# Patient Record
Sex: Male | Born: 1965 | Race: White | Hispanic: No | Smoking: Current every day smoker
Health system: Southern US, Community
[De-identification: ages and names within clinical notes are randomized; demographics above are authoritative.]

## PROBLEM LIST (undated history)

## (undated) DIAGNOSIS — M1990 Unspecified osteoarthritis, unspecified site: Secondary | ICD-10-CM

## (undated) DIAGNOSIS — F32A Depression, unspecified: Secondary | ICD-10-CM

## (undated) DIAGNOSIS — E7800 Pure hypercholesterolemia, unspecified: Secondary | ICD-10-CM

## (undated) DIAGNOSIS — R39198 Other difficulties with micturition: Secondary | ICD-10-CM

## (undated) DIAGNOSIS — F419 Anxiety disorder, unspecified: Secondary | ICD-10-CM

## (undated) DIAGNOSIS — F329 Major depressive disorder, single episode, unspecified: Secondary | ICD-10-CM

## (undated) DIAGNOSIS — I10 Essential (primary) hypertension: Secondary | ICD-10-CM

## (undated) DIAGNOSIS — M797 Fibromyalgia: Secondary | ICD-10-CM

## (undated) HISTORY — PX: TONSILLECTOMY: SUR1361

## (undated) HISTORY — PX: CHOLECYSTECTOMY: SHX55

## (undated) HISTORY — PX: HERNIA REPAIR: SHX51

## (undated) HISTORY — PX: APPENDECTOMY: SHX54

---

## 2013-10-23 ENCOUNTER — Ambulatory Visit: Payer: Medicaid Other | Admitting: Neurology

## 2018-01-04 DIAGNOSIS — E785 Hyperlipidemia, unspecified: Secondary | ICD-10-CM | POA: Diagnosis not present

## 2018-01-04 DIAGNOSIS — Z720 Tobacco use: Secondary | ICD-10-CM | POA: Diagnosis not present

## 2018-01-04 DIAGNOSIS — R739 Hyperglycemia, unspecified: Secondary | ICD-10-CM | POA: Diagnosis not present

## 2018-01-04 DIAGNOSIS — N400 Enlarged prostate without lower urinary tract symptoms: Secondary | ICD-10-CM | POA: Diagnosis not present

## 2018-01-04 DIAGNOSIS — M797 Fibromyalgia: Secondary | ICD-10-CM | POA: Diagnosis not present

## 2018-01-04 DIAGNOSIS — I639 Cerebral infarction, unspecified: Secondary | ICD-10-CM | POA: Diagnosis not present

## 2018-01-04 DIAGNOSIS — R112 Nausea with vomiting, unspecified: Secondary | ICD-10-CM | POA: Diagnosis not present

## 2018-01-04 DIAGNOSIS — I10 Essential (primary) hypertension: Secondary | ICD-10-CM | POA: Diagnosis not present

## 2018-01-05 DIAGNOSIS — Z720 Tobacco use: Secondary | ICD-10-CM | POA: Diagnosis not present

## 2018-01-05 DIAGNOSIS — R739 Hyperglycemia, unspecified: Secondary | ICD-10-CM | POA: Diagnosis not present

## 2018-01-05 DIAGNOSIS — M797 Fibromyalgia: Secondary | ICD-10-CM | POA: Diagnosis not present

## 2018-01-05 DIAGNOSIS — E785 Hyperlipidemia, unspecified: Secondary | ICD-10-CM | POA: Diagnosis not present

## 2018-01-05 DIAGNOSIS — R112 Nausea with vomiting, unspecified: Secondary | ICD-10-CM | POA: Diagnosis not present

## 2018-01-05 DIAGNOSIS — I639 Cerebral infarction, unspecified: Secondary | ICD-10-CM | POA: Diagnosis not present

## 2018-01-05 DIAGNOSIS — I517 Cardiomegaly: Secondary | ICD-10-CM

## 2018-01-05 DIAGNOSIS — I10 Essential (primary) hypertension: Secondary | ICD-10-CM | POA: Diagnosis not present

## 2018-01-05 DIAGNOSIS — N400 Enlarged prostate without lower urinary tract symptoms: Secondary | ICD-10-CM | POA: Diagnosis not present

## 2018-03-11 ENCOUNTER — Other Ambulatory Visit: Payer: Self-pay | Admitting: Neurosurgery

## 2018-03-21 ENCOUNTER — Other Ambulatory Visit: Payer: Self-pay | Admitting: Neurosurgery

## 2018-03-29 ENCOUNTER — Encounter (HOSPITAL_COMMUNITY): Payer: Self-pay

## 2018-03-29 ENCOUNTER — Encounter (HOSPITAL_COMMUNITY)
Admission: RE | Admit: 2018-03-29 | Discharge: 2018-03-29 | Disposition: A | Discharge status: Home / Self Care | Payer: Medicaid Other | Source: Ambulatory Visit | Attending: Neurosurgery | Admitting: Neurosurgery

## 2018-03-29 ENCOUNTER — Other Ambulatory Visit: Payer: Self-pay

## 2018-03-29 DIAGNOSIS — M5010 Cervical disc disorder with radiculopathy, unspecified cervical region: Secondary | ICD-10-CM | POA: Diagnosis not present

## 2018-03-29 DIAGNOSIS — Z8673 Personal history of transient ischemic attack (TIA), and cerebral infarction without residual deficits: Secondary | ICD-10-CM | POA: Diagnosis not present

## 2018-03-29 DIAGNOSIS — F419 Anxiety disorder, unspecified: Secondary | ICD-10-CM | POA: Diagnosis not present

## 2018-03-29 DIAGNOSIS — Z01812 Encounter for preprocedural laboratory examination: Secondary | ICD-10-CM | POA: Insufficient documentation

## 2018-03-29 DIAGNOSIS — E781 Pure hyperglyceridemia: Secondary | ICD-10-CM | POA: Diagnosis not present

## 2018-03-29 DIAGNOSIS — I10 Essential (primary) hypertension: Secondary | ICD-10-CM | POA: Diagnosis not present

## 2018-03-29 DIAGNOSIS — F329 Major depressive disorder, single episode, unspecified: Secondary | ICD-10-CM | POA: Insufficient documentation

## 2018-03-29 HISTORY — DX: Essential (primary) hypertension: I10

## 2018-03-29 HISTORY — DX: Fibromyalgia: M79.7

## 2018-03-29 HISTORY — DX: Other difficulties with micturition: R39.198

## 2018-03-29 HISTORY — DX: Anxiety disorder, unspecified: F41.9

## 2018-03-29 HISTORY — DX: Unspecified osteoarthritis, unspecified site: M19.90

## 2018-03-29 HISTORY — DX: Major depressive disorder, single episode, unspecified: F32.9

## 2018-03-29 HISTORY — DX: Pure hypercholesterolemia, unspecified: E78.00

## 2018-03-29 HISTORY — DX: Depression, unspecified: F32.A

## 2018-03-29 LAB — BASIC METABOLIC PANEL
ANION GAP: 6 (ref 5–15)
BUN: 11 mg/dL (ref 6–20)
CHLORIDE: 107 mmol/L (ref 98–111)
CO2: 26 mmol/L (ref 22–32)
Calcium: 9 mg/dL (ref 8.9–10.3)
Creatinine, Ser: 1.03 mg/dL (ref 0.61–1.24)
GFR calc Af Amer: >60 mL/min (ref >60)
GLUCOSE: 126 mg/dL — AB (ref 70–99)
POTASSIUM: 4.6 mmol/L (ref 3.5–5.1)
Sodium: 139 mmol/L (ref 135–145)

## 2018-03-29 LAB — TYPE AND SCREEN
ABO/RH(D): O POS
ANTIBODY SCREEN: NEGATIVE

## 2018-03-29 LAB — CBC
HEMATOCRIT: 41.9 % (ref 39.0–52.0)
HEMOGLOBIN: 13.9 g/dL (ref 13.0–17.0)
MCH: 30.4 pg (ref 26.0–34.0)
MCHC: 33.2 g/dL (ref 30.0–36.0)
MCV: 91.7 fL (ref 78.0–100.0)
Platelets: 202 10*3/uL (ref 150–400)
RBC: 4.57 MIL/uL (ref 4.22–5.81)
RDW: 13.1 % (ref 11.5–15.5)
WBC: 7.5 10*3/uL (ref 4.0–10.5)

## 2018-03-29 LAB — SURGICAL PCR SCREEN
MRSA, PCR: NEGATIVE
STAPHYLOCOCCUS AUREUS: NEGATIVE

## 2018-03-29 LAB — ABO/RH: ABO/RH(D): O POS

## 2018-03-29 NOTE — Pre-Procedure Instructions (Signed)
Abdon Walgren  03/29/2018      [id number] - [place], [place] - [address] [place] [address] Phone: [phone] Fax: [phone]    Your procedure is scheduled on July 11  Report to [facility] Admitting at 1045 A.M.  Call this number if you have problems the morning of surgery:  [phone]   Remember:  Do not eat or drink after midnight.    Take these medicines the morning of surgery with A SIP OF WATER  gabapentin (NEURONTIN)  Oxycodone   7 days prior to surgery STOP taking any Aspirin(unless otherwise instructed by your surgeon), Aleve, Naproxen, Ibuprofen, Motrin, Advil, Goody's, BC's, all herbal medications, fish oil, and all vitamins  Follow your doctors instructions regarding your Aspirin.  If no instructions were given by your doctor, then you will need to call the prescribing office office to get instructions.       Do not wear jewelry  Do not wear lotions, powders, or cologne, or deodorant.  Men may shave face and neck.  Do not bring valuables to the hospital.  [facility] is not responsible for any belongings or valuables.  Contacts, dentures or bridgework may not be worn into surgery.  Leave your suitcase in the car.  After surgery it may be brought to your room.  For patients admitted to the hospital, discharge time will be determined by your treatment team.  Patients discharged the day of surgery will not be allowed to drive home.    Special instructions:   [facility]- Preparing For Surgery  Before surgery, you can play an important role. Because skin is not sterile, your skin needs to be as free of germs as possible. You can reduce the number of germs on your skin by washing with CHG (chlorahexidine gluconate) Soap before surgery.  CHG is an antiseptic cleaner which kills germs and bonds with the skin to continue killing germs even after washing.    Oral Hygiene is also important to reduce your risk of  infection.  Remember - BRUSH YOUR TEETH THE MORNING OF SURGERY WITH YOUR REGULAR TOOTHPASTE  Please do not use if you have an allergy to CHG or antibacterial soaps. If your skin becomes reddened/irritated stop using the CHG.  Do not shave (including legs and underarms) for at least 48 hours prior to first CHG shower. It is OK to shave your face.  Please follow these instructions carefully.   1. Shower the NIGHT BEFORE SURGERY and the MORNING OF SURGERY with CHG.   2. If you chose to wash your hair, wash your hair first as usual with your normal shampoo.  3. After you shampoo, rinse your hair and body thoroughly to remove the shampoo.  4. Use CHG as you would any other liquid soap. You can apply CHG directly to the skin and wash gently with a scrungie or a clean washcloth.   5. Apply the CHG Soap to your body ONLY FROM THE NECK DOWN.  Do not use on open wounds or open sores. Avoid contact with your eyes, ears, mouth and genitals (private parts). Wash Face and genitals (private parts)  with your normal soap.  6. Wash thoroughly, paying special attention to the area where your surgery will be performed.  7. Thoroughly rinse your body with warm water from the neck down.  8. DO NOT shower/wash with your normal soap after using and rinsing off the CHG Soap.  9. Pat yourself  dry with a CLEAN TOWEL.  10. Wear CLEAN PAJAMAS to bed the night before surgery, wear comfortable clothes the morning of surgery  11. Place CLEAN SHEETS on your bed the night of your first shower and DO NOT SLEEP WITH PETS.    Day of Surgery:  Do not apply any deodorants/lotions.  Please wear clean clothes to the hospital/surgery center.   Remember to brush your teeth WITH YOUR REGULAR TOOTHPASTE.     Please read over the following fact sheets that you were given.

## 2018-03-29 NOTE — Progress Notes (Signed)
Anesthesia Chart Review:   Case:  [id number] Date/Time:  04/07/18 1230   Procedure:  ANTERIOR CERVICAL DECOMPRESSION/DISCECTOMY FUSION CERVICAL 5- CERVICAL 6, CERVICAL 6- CERVICAL 7 (N/A ) - ANTERIOR CERVICAL DECOMPRESSION/DISCECTOMY FUSION CERVICAL 5- CERVICAL 6, CERVICAL 6- CERVICAL 7   Anesthesia type:  General   Pre-op diagnosis:  CERVICAL DISC DISORDER WITH RADICULOPATHY   Location:  [facility] OR ROOM 20 / [facility] OR   Surgeon:  Craig Renshaw, MD      DISCUSSION: - Pt is a 52 year old male with hx CVA (found on imaging 2014), HTN, very high triglycerides (470 in 12/2017)  - Pt reported at pre-admission testing he had a stroke "about a month ago" but does not see neurology.  Records from [facility] obtained and reviewed.  Pt admitted 4/9-4/10/19 for evaluation L sided neck pain and slurred speech.  TIA/stroke suspected initially, but work up showed only presumed chronic small vessel changes and remote infarct present from 2014.  Neck pain determined to be musculoskeletal, slurred speech resolved.  Pt advised to f/u with PCP  - Pt reported at pre-admission testing he was told he has "afib" at [facility] in March of this year. I cannot find any documentation to support this. Does not see cardiology, is on no medications related to afib tx.  Per Craig Hamilton in [facility] medical records dept, pt was not seen in that health system in March 2019, nor do any of the EKGs in Craig Hamilton medical records at [facility] show afib.  Records from PCP's office also reviewed;  afib is not listed in history.     VS: BP (!) 145/92 Comment: pt stated that he has ran out of bp meds  Pulse 81   Temp 36.6 C   Resp 20   Ht 5\' 11"  (1.803 m)   Wt 193 lb 11.2 oz (87.9 kg)   SpO2 96%   BMI 27.02 kg/m    PROVIDERS: - [facility] care at [facility]   LABS: Labs reviewed: Acceptable for surgery. (all labs ordered are listed, but only abnormal results are displayed)  Labs Reviewed  BASIC  METABOLIC PANEL - Abnormal; Notable for the following components:      Result Value   Glucose, Bld 126 (*)    All other components within normal limits  SURGICAL PCR SCREEN  CBC  TYPE AND SCREEN  ABO/RH     IMAGES:  CXR 02/07/18 [facility]): No acute cardiopulmonary process is seen   EKG 01/05/18 [facility]): NSR   CV:  Echo 01/04/18 [facility]):  - Overall LV systolic function is normal with EF 60-65% - no evidence of interatrial communication by color flow doppler analysis.  Negative bubble study.  - Interatrial and interventricular septum intact - no evidence of aortic regurgitation. No evidence of aortic stenosis - Mild mitral regurgitation - Mild tricuspid regurgitation  Nuclear stress test 05/27/15 [facility]):  1. No ischemia seen on this scan 2. Normal gated images 3. Normal EF calculated at 61%    Past Medical History:  Diagnosis Date  . Anxiety   . Arthritis   . Depression   . Difficulty urinating   . Fibromyalgia   . High cholesterol   . Hypertension     Past Surgical History:  Procedure Laterality Date  . APPENDECTOMY    . CHOLECYSTECTOMY    . HERNIA REPAIR     groin left  . TONSILLECTOMY      MEDICATIONS: . aspirin EC 81 MG  tablet  . atorvastatin (LIPITOR) 80 MG tablet  . cyclobenzaprine (FLEXERIL) 10 MG tablet  . fenofibrate 160 MG tablet  . gabapentin (NEURONTIN) 300 MG capsule  . gemfibrozil (LOPID) 600 MG tablet  . lisinopril-hydrochlorothiazide (PRINZIDE,ZESTORETIC) 20-12.5 MG tablet  . Oxycodone HCl 20 MG TABS  . testosterone cypionate (DEPOTESTOSTERONE CYPIONATE) 200 MG/ML injection   No current facility-administered medications for this encounter.     If no changes, I anticipate pt can proceed with surgery as scheduled.   Craig Mastngela Tyreesha Maharaj, FNP-BC [facility]Anesthesiology Phone: [phone] 03/30/2018 9:59 AM

## 2018-03-29 NOTE — Progress Notes (Signed)
[facility] Ryan miller Cardiologist - denies  Chest x-ray - requesting EKG - requestin Stress Test - requesting ECHO - requesting Cardiac Cath - denies  Aspirin Instructions: instructed patient to call office for instructions  Anesthesia review: yes  Patient denies shortness of breath, fever, cough and chest pain at PAT appointment   Patient verbalized understanding of instructions that were given to them at the PAT appointment. Patient was also instructed that they will need to review over the PAT instructions again at home before surgery.

## 2018-03-30 ENCOUNTER — Encounter (HOSPITAL_COMMUNITY): Payer: Self-pay

## 2018-04-07 ENCOUNTER — Ambulatory Visit (HOSPITAL_COMMUNITY): Payer: Medicaid Other

## 2018-04-07 ENCOUNTER — Encounter (HOSPITAL_COMMUNITY): Payer: Self-pay | Admitting: *Deleted

## 2018-04-07 ENCOUNTER — Ambulatory Visit (HOSPITAL_COMMUNITY)
Admission: RE | Admit: 2018-04-07 | Discharge: 2018-04-08 | Disposition: A | Discharge status: Home / Self Care | Payer: Medicaid Other | Source: Ambulatory Visit | Attending: Neurosurgery | Admitting: Neurosurgery

## 2018-04-07 ENCOUNTER — Ambulatory Visit (HOSPITAL_COMMUNITY): Payer: Medicaid Other | Admitting: Emergency Medicine

## 2018-04-07 ENCOUNTER — Other Ambulatory Visit: Payer: Self-pay

## 2018-04-07 ENCOUNTER — Inpatient Hospital Stay (HOSPITAL_COMMUNITY)
Admission: RE | Disposition: A | Discharge status: Home / Self Care | Payer: Self-pay | Source: Ambulatory Visit | Attending: Neurosurgery

## 2018-04-07 ENCOUNTER — Ambulatory Visit (HOSPITAL_COMMUNITY): Payer: Medicaid Other | Admitting: Certified Registered"

## 2018-04-07 DIAGNOSIS — M797 Fibromyalgia: Secondary | ICD-10-CM | POA: Diagnosis not present

## 2018-04-07 DIAGNOSIS — E7800 Pure hypercholesterolemia, unspecified: Secondary | ICD-10-CM | POA: Diagnosis not present

## 2018-04-07 DIAGNOSIS — Z7982 Long term (current) use of aspirin: Secondary | ICD-10-CM | POA: Insufficient documentation

## 2018-04-07 DIAGNOSIS — M4722 Other spondylosis with radiculopathy, cervical region: Secondary | ICD-10-CM | POA: Diagnosis not present

## 2018-04-07 DIAGNOSIS — F329 Major depressive disorder, single episode, unspecified: Secondary | ICD-10-CM | POA: Insufficient documentation

## 2018-04-07 DIAGNOSIS — I10 Essential (primary) hypertension: Secondary | ICD-10-CM | POA: Diagnosis not present

## 2018-04-07 DIAGNOSIS — F419 Anxiety disorder, unspecified: Secondary | ICD-10-CM | POA: Insufficient documentation

## 2018-04-07 DIAGNOSIS — F17210 Nicotine dependence, cigarettes, uncomplicated: Secondary | ICD-10-CM | POA: Diagnosis not present

## 2018-04-07 DIAGNOSIS — Z79899 Other long term (current) drug therapy: Secondary | ICD-10-CM | POA: Insufficient documentation

## 2018-04-07 DIAGNOSIS — Z419 Encounter for procedure for purposes other than remedying health state, unspecified: Secondary | ICD-10-CM

## 2018-04-07 DIAGNOSIS — M5412 Radiculopathy, cervical region: Secondary | ICD-10-CM | POA: Diagnosis present

## 2018-04-07 HISTORY — PX: ANTERIOR CERVICAL DECOMP/DISCECTOMY FUSION: SHX1161

## 2018-04-07 SURGERY — ANTERIOR CERVICAL DECOMPRESSION/DISCECTOMY FUSION 2 LEVELS
Anesthesia: General | Site: Spine Cervical

## 2018-04-07 MED ORDER — OXYCODONE HCL 5 MG/5ML PO SOLN: 5.0000 mg | Freq: Once | ORAL | Status: DC | PRN

## 2018-04-07 MED ORDER — ACETAMINOPHEN 500 MG PO TABS
1000.0000 mg | ORAL_TABLET | Freq: Four times a day (QID) | ORAL | Status: DC
  Administered 2018-04-07 – 2018-04-08 (×3): 1000 mg via ORAL
  Filled 2018-04-07 (×3): qty 2

## 2018-04-07 MED ORDER — BUPIVACAINE HCL (PF) 0.5 % IJ SOLN
INTRAMUSCULAR | Status: AC
  Filled 2018-04-07: qty 30

## 2018-04-07 MED ORDER — ONDANSETRON HCL 4 MG/2ML IJ SOLN
INTRAMUSCULAR | Status: DC | PRN
  Administered 2018-04-07: 4 mg via INTRAVENOUS

## 2018-04-07 MED ORDER — PROPOFOL 10 MG/ML IV BOLUS
INTRAVENOUS | Status: DC | PRN
  Administered 2018-04-07: 120 mg via INTRAVENOUS

## 2018-04-07 MED ORDER — METHOCARBAMOL 1000 MG/10ML IJ SOLN
500.0000 mg | Freq: Four times a day (QID) | INTRAVENOUS | Status: DC | PRN
  Filled 2018-04-07: qty 5

## 2018-04-07 MED ORDER — VANCOMYCIN HCL 10 G IV SOLR
1250.0000 mg | Freq: Once | INTRAVENOUS | Status: AC
  Administered 2018-04-08: 1250 mg via INTRAVENOUS
  Filled 2018-04-07: qty 1250

## 2018-04-07 MED ORDER — FENTANYL CITRATE (PF) 250 MCG/5ML IJ SOLN
INTRAMUSCULAR | Status: AC
  Filled 2018-04-07: qty 5

## 2018-04-07 MED ORDER — LIDOCAINE HCL (CARDIAC) PF 100 MG/5ML IV SOSY
PREFILLED_SYRINGE | INTRAVENOUS | Status: DC | PRN
  Administered 2018-04-07: 60 mg via INTRAVENOUS

## 2018-04-07 MED ORDER — ONDANSETRON HCL 4 MG/2ML IJ SOLN: 4.0000 mg | Freq: Four times a day (QID) | INTRAMUSCULAR | Status: DC | PRN

## 2018-04-07 MED ORDER — THROMBIN 20000 UNITS EX SOLR
CUTANEOUS | Status: DC | PRN
  Administered 2018-04-07: 14:00:00 via TOPICAL

## 2018-04-07 MED ORDER — OXYCODONE HCL 5 MG PO TABS
5.0000 mg | ORAL_TABLET | ORAL | Status: DC | PRN
  Administered 2018-04-07 – 2018-04-08 (×5): 10 mg via ORAL
  Filled 2018-04-07 (×5): qty 2

## 2018-04-07 MED ORDER — FENTANYL CITRATE (PF) 100 MCG/2ML IJ SOLN: 25.0000 ug | INTRAMUSCULAR | Status: DC | PRN

## 2018-04-07 MED ORDER — CHLORHEXIDINE GLUCONATE CLOTH 2 % EX PADS: 6.0000 | MEDICATED_PAD | Freq: Once | CUTANEOUS | Status: DC

## 2018-04-07 MED ORDER — HYDROMORPHONE HCL 1 MG/ML IJ SOLN
0.5000 mg | INTRAMUSCULAR | Status: DC | PRN
  Administered 2018-04-07 (×2): 1 mg via INTRAVENOUS
  Filled 2018-04-07 (×2): qty 1

## 2018-04-07 MED ORDER — CEFAZOLIN SODIUM-DEXTROSE 2-4 GM/100ML-% IV SOLN
2.0000 g | INTRAVENOUS | Status: AC
  Administered 2018-04-07: 2 g via INTRAVENOUS

## 2018-04-07 MED ORDER — 0.9 % SODIUM CHLORIDE (POUR BTL) OPTIME
TOPICAL | Status: DC | PRN
  Administered 2018-04-07: 1000 mL

## 2018-04-07 MED ORDER — BISACODYL 10 MG RE SUPP: 10.0000 mg | Freq: Every day | RECTAL | Status: DC | PRN

## 2018-04-07 MED ORDER — LIDOCAINE 2% (20 MG/ML) 5 ML SYRINGE
INTRAMUSCULAR | Status: AC
  Filled 2018-04-07: qty 5

## 2018-04-07 MED ORDER — FENTANYL CITRATE (PF) 100 MCG/2ML IJ SOLN
INTRAMUSCULAR | Status: DC | PRN
  Administered 2018-04-07 (×8): 50 ug via INTRAVENOUS
  Administered 2018-04-07: 150 ug via INTRAVENOUS

## 2018-04-07 MED ORDER — GABAPENTIN 300 MG PO CAPS
300.0000 mg | ORAL_CAPSULE | Freq: Three times a day (TID) | ORAL | Status: DC
  Administered 2018-04-07 – 2018-04-08 (×3): 300 mg via ORAL
  Filled 2018-04-07 (×3): qty 1

## 2018-04-07 MED ORDER — HYDROCODONE-ACETAMINOPHEN 5-325 MG PO TABS: 1.0000 | ORAL_TABLET | ORAL | Status: DC | PRN

## 2018-04-07 MED ORDER — SENNOSIDES-DOCUSATE SODIUM 8.6-50 MG PO TABS: 1.0000 | ORAL_TABLET | Freq: Every evening | ORAL | Status: DC | PRN

## 2018-04-07 MED ORDER — FLEET ENEMA 7-19 GM/118ML RE ENEM: 1.0000 | ENEMA | Freq: Once | RECTAL | Status: DC | PRN

## 2018-04-07 MED ORDER — SENNA 8.6 MG PO TABS
1.0000 | ORAL_TABLET | Freq: Two times a day (BID) | ORAL | Status: DC
  Administered 2018-04-07 – 2018-04-08 (×2): 8.6 mg via ORAL
  Filled 2018-04-07 (×2): qty 1

## 2018-04-07 MED ORDER — SODIUM CHLORIDE 0.9 % IV SOLN
INTRAVENOUS | Status: DC | PRN
  Administered 2018-04-07: 500 mL

## 2018-04-07 MED ORDER — HEMOSTATIC AGENTS (NO CHARGE) OPTIME
TOPICAL | Status: DC | PRN
  Administered 2018-04-07 (×2): 1 via TOPICAL

## 2018-04-07 MED ORDER — ALBUTEROL SULFATE HFA 108 (90 BASE) MCG/ACT IN AERS
INHALATION_SPRAY | RESPIRATORY_TRACT | Status: AC
  Filled 2018-04-07: qty 6.7

## 2018-04-07 MED ORDER — MIDAZOLAM HCL 5 MG/5ML IJ SOLN
INTRAMUSCULAR | Status: DC | PRN
  Administered 2018-04-07: 2 mg via INTRAVENOUS

## 2018-04-07 MED ORDER — CEFAZOLIN SODIUM-DEXTROSE 2-4 GM/100ML-% IV SOLN
INTRAVENOUS | Status: AC
  Filled 2018-04-07: qty 100

## 2018-04-07 MED ORDER — THROMBIN (RECOMBINANT) 20000 UNITS EX SOLR
CUTANEOUS | Status: AC
  Filled 2018-04-07: qty 20000

## 2018-04-07 MED ORDER — PHENOL 1.4 % MT LIQD: 1.0000 | OROMUCOSAL | Status: DC | PRN

## 2018-04-07 MED ORDER — ACETAMINOPHEN 325 MG PO TABS: 650.0000 mg | ORAL_TABLET | ORAL | Status: DC | PRN

## 2018-04-07 MED ORDER — PROPOFOL 10 MG/ML IV BOLUS
INTRAVENOUS | Status: AC
  Filled 2018-04-07: qty 20

## 2018-04-07 MED ORDER — BUPIVACAINE HCL 0.5 % IJ SOLN
INTRAMUSCULAR | Status: DC | PRN
  Administered 2018-04-07: 3.5 mL

## 2018-04-07 MED ORDER — ONDANSETRON HCL 4 MG PO TABS: 4.0000 mg | ORAL_TABLET | Freq: Four times a day (QID) | ORAL | Status: DC | PRN

## 2018-04-07 MED ORDER — ACETAMINOPHEN 650 MG RE SUPP: 650.0000 mg | RECTAL | Status: DC | PRN

## 2018-04-07 MED ORDER — PHENYLEPHRINE 40 MCG/ML (10ML) SYRINGE FOR IV PUSH (FOR BLOOD PRESSURE SUPPORT)
PREFILLED_SYRINGE | INTRAVENOUS | Status: DC | PRN
  Administered 2018-04-07: 80 ug via INTRAVENOUS
  Administered 2018-04-07: 120 ug via INTRAVENOUS
  Administered 2018-04-07: 80 ug via INTRAVENOUS

## 2018-04-07 MED ORDER — LIDOCAINE-EPINEPHRINE 1 %-1:100000 IJ SOLN
INTRAMUSCULAR | Status: DC | PRN
  Administered 2018-04-07: 3.5 mL

## 2018-04-07 MED ORDER — DEXAMETHASONE SODIUM PHOSPHATE 10 MG/ML IJ SOLN
INTRAMUSCULAR | Status: DC | PRN
  Administered 2018-04-07: 10 mg via INTRAVENOUS

## 2018-04-07 MED ORDER — MENTHOL 3 MG MT LOZG: 1.0000 | LOZENGE | OROMUCOSAL | Status: DC | PRN

## 2018-04-07 MED ORDER — SODIUM CHLORIDE 0.9% FLUSH: 3.0000 mL | INTRAVENOUS | Status: DC | PRN

## 2018-04-07 MED ORDER — SODIUM CHLORIDE 0.9% FLUSH: 3.0000 mL | Freq: Two times a day (BID) | INTRAVENOUS | Status: DC

## 2018-04-07 MED ORDER — SUGAMMADEX SODIUM 200 MG/2ML IV SOLN
INTRAVENOUS | Status: DC | PRN
  Administered 2018-04-07: 180 mg via INTRAVENOUS

## 2018-04-07 MED ORDER — LIDOCAINE-EPINEPHRINE 1 %-1:100000 IJ SOLN
INTRAMUSCULAR | Status: AC
  Filled 2018-04-07: qty 1

## 2018-04-07 MED ORDER — MIDAZOLAM HCL 2 MG/2ML IJ SOLN
INTRAMUSCULAR | Status: AC
  Filled 2018-04-07: qty 2

## 2018-04-07 MED ORDER — SODIUM CHLORIDE 0.9 % IV SOLN: 250.0000 mL | INTRAVENOUS | Status: DC

## 2018-04-07 MED ORDER — SODIUM CHLORIDE 0.9 % IV SOLN: INTRAVENOUS | Status: DC

## 2018-04-07 MED ORDER — METHOCARBAMOL 500 MG PO TABS
500.0000 mg | ORAL_TABLET | Freq: Four times a day (QID) | ORAL | Status: DC | PRN
Start: 2018-04-07 — End: 2018-04-08
  Administered 2018-04-07 – 2018-04-08 (×3): 500 mg via ORAL
  Filled 2018-04-07 (×3): qty 1

## 2018-04-07 MED ORDER — ROCURONIUM BROMIDE 100 MG/10ML IV SOLN
INTRAVENOUS | Status: DC | PRN
  Administered 2018-04-07: 40 mg via INTRAVENOUS
  Administered 2018-04-07: 60 mg via INTRAVENOUS

## 2018-04-07 MED ORDER — LACTATED RINGERS IV SOLN
INTRAVENOUS | Status: DC
  Administered 2018-04-07 (×3): via INTRAVENOUS

## 2018-04-07 MED ORDER — OXYCODONE HCL 5 MG PO TABS: 5.0000 mg | ORAL_TABLET | Freq: Once | ORAL | Status: DC | PRN

## 2018-04-07 MED ORDER — DOCUSATE SODIUM 100 MG PO CAPS
100.0000 mg | ORAL_CAPSULE | Freq: Two times a day (BID) | ORAL | Status: DC
  Administered 2018-04-07 – 2018-04-08 (×2): 100 mg via ORAL
  Filled 2018-04-07 (×2): qty 1

## 2018-04-07 SURGICAL SUPPLY — 69 items
BAG DECANTER FOR FLEXI CONT (MISCELLANEOUS) ×3 IMPLANT
BENZOIN TINCTURE PRP APPL 2/3 (GAUZE/BANDAGES/DRESSINGS) IMPLANT
BLADE CLIPPER SURG (BLADE) IMPLANT
BLADE SURG 11 STRL SS (BLADE) ×3 IMPLANT
BLADE ULTRA TIP 2M (BLADE) IMPLANT
BNDG GAUZE ELAST 4 BULKY (GAUZE/BANDAGES/DRESSINGS) ×6 IMPLANT
BONE XPANSE 9.5 X 8.5 X 6MM (Bone Implant) ×2 IMPLANT
BUR MATCHSTICK NEURO 3.0 LAGG (BURR) ×3 IMPLANT
CAGE PEEK TI 6X16X14 12D (Cage) ×6 IMPLANT
CANISTER SUCT 3000ML PPV (MISCELLANEOUS) ×3 IMPLANT
CARTRIDGE OIL MAESTRO DRILL (MISCELLANEOUS) ×1 IMPLANT
CLOSURE WOUND 1/2 X4 (GAUZE/BANDAGES/DRESSINGS)
DECANTER SPIKE VIAL GLASS SM (MISCELLANEOUS) ×3 IMPLANT
DERMABOND ADVANCED (GAUZE/BANDAGES/DRESSINGS) ×2
DERMABOND ADVANCED .7 DNX12 (GAUZE/BANDAGES/DRESSINGS) ×1 IMPLANT
DIFFUSER DRILL AIR PNEUMATIC (MISCELLANEOUS) ×3 IMPLANT
DRAIN CHANNEL 10M FLAT 3/4 FLT (DRAIN) IMPLANT
DRAPE C-ARM 42X72 X-RAY (DRAPES) ×6 IMPLANT
DRAPE HALF SHEET 40X57 (DRAPES) IMPLANT
DRAPE LAPAROTOMY 100X72 PEDS (DRAPES) ×3 IMPLANT
DRAPE MICROSCOPE LEICA (MISCELLANEOUS) ×3 IMPLANT
DRSG OPSITE 4X5.5 SM (GAUZE/BANDAGES/DRESSINGS) ×6 IMPLANT
DRSG OPSITE POSTOP 3X4 (GAUZE/BANDAGES/DRESSINGS) ×6 IMPLANT
DRSG OPSITE POSTOP 4X6 (GAUZE/BANDAGES/DRESSINGS) ×3 IMPLANT
DURAPREP 6ML APPLICATOR 50/CS (WOUND CARE) ×3 IMPLANT
ELECT COATED BLADE 2.86 ST (ELECTRODE) ×3 IMPLANT
ELECT REM PT RETURN 9FT ADLT (ELECTROSURGICAL) ×3
ELECTRODE REM PT RTRN 9FT ADLT (ELECTROSURGICAL) ×1 IMPLANT
EVACUATOR SILICONE 100CC (DRAIN) IMPLANT
FLOSEAL 5ML (HEMOSTASIS) ×3 IMPLANT
GAUZE SPONGE 4X4 16PLY XRAY LF (GAUZE/BANDAGES/DRESSINGS) IMPLANT
GLOVE BIO SURGEON STRL SZ7.5 (GLOVE) ×3 IMPLANT
GLOVE BIO SURGEON STRL SZ8 (GLOVE) ×6 IMPLANT
GLOVE BIOGEL PI IND STRL 6.5 (GLOVE) ×2 IMPLANT
GLOVE BIOGEL PI IND STRL 7.5 (GLOVE) ×2 IMPLANT
GLOVE BIOGEL PI IND STRL 8.5 (GLOVE) ×1 IMPLANT
GLOVE BIOGEL PI INDICATOR 6.5 (GLOVE) ×4
GLOVE BIOGEL PI INDICATOR 7.5 (GLOVE) ×4
GLOVE BIOGEL PI INDICATOR 8.5 (GLOVE) ×2
GLOVE ECLIPSE 7.0 STRL STRAW (GLOVE) ×3 IMPLANT
GLOVE SURG SS PI 6.5 STRL IVOR (GLOVE) ×6 IMPLANT
GOWN STRL REUS W/ TWL LRG LVL3 (GOWN DISPOSABLE) ×3 IMPLANT
GOWN STRL REUS W/ TWL XL LVL3 (GOWN DISPOSABLE) ×1 IMPLANT
GOWN STRL REUS W/TWL 2XL LVL3 (GOWN DISPOSABLE) IMPLANT
GOWN STRL REUS W/TWL LRG LVL3 (GOWN DISPOSABLE) ×6
GOWN STRL REUS W/TWL XL LVL3 (GOWN DISPOSABLE) ×2
INSERT GRAFT XPANSE 9.5X8.5X6 (Bone Implant) ×4 IMPLANT
KIT BASIN OR (CUSTOM PROCEDURE TRAY) ×3 IMPLANT
KIT TURNOVER KIT B (KITS) ×3 IMPLANT
NEEDLE HYPO 22GX1.5 SAFETY (NEEDLE) ×3 IMPLANT
NEEDLE SPNL 22GX3.5 QUINCKE BK (NEEDLE) ×3 IMPLANT
NS IRRIG 1000ML POUR BTL (IV SOLUTION) ×3 IMPLANT
OIL CARTRIDGE MAESTRO DRILL (MISCELLANEOUS) ×3
PACK LAMINECTOMY NEURO (CUSTOM PROCEDURE TRAY) ×3 IMPLANT
PAD ARMBOARD 7.5X6 YLW CONV (MISCELLANEOUS) ×9 IMPLANT
PLATE 2 42.5XLCK NS SPNE CVD (Plate) ×1 IMPLANT
PLATE 2 ATLANTIS TRANS (Plate) ×2 IMPLANT
RUBBERBAND STERILE (MISCELLANEOUS) ×6 IMPLANT
SCREW SELF TAP VAR 4.0X13 (Screw) ×18 IMPLANT
SPONGE INTESTINAL PEANUT (DISPOSABLE) ×3 IMPLANT
SPONGE SURGIFOAM ABS GEL 100 (HEMOSTASIS) ×3 IMPLANT
STRIP CLOSURE SKIN 1/2X4 (GAUZE/BANDAGES/DRESSINGS) IMPLANT
SUT ETHILON 3 0 FSL (SUTURE) IMPLANT
SUT VIC AB 3-0 SH 8-18 (SUTURE) ×3 IMPLANT
SUT VICRYL 3-0 RB1 18 ABS (SUTURE) ×6 IMPLANT
TAPE CLOTH 3X10 TAN LF (GAUZE/BANDAGES/DRESSINGS) ×3 IMPLANT
TOWEL GREEN STERILE (TOWEL DISPOSABLE) ×3 IMPLANT
TOWEL GREEN STERILE FF (TOWEL DISPOSABLE) ×3 IMPLANT
WATER STERILE IRR 1000ML POUR (IV SOLUTION) ×3 IMPLANT

## 2018-04-07 NOTE — Progress Notes (Signed)
Pharmacy Antibiotic Note  Craig Hamilton is a 52 y.o. male admitted on 04/07/2018 with surgical prophylaxis.  Pharmacy has been consulted for a single Vancomycin dose 12 hours post-op (patient does not have any post-op drains that warrant ongoing dosing).  Plan: Vancomycin 1250 mg IV x 1  Height: 5\' 11"  (180.3 cm) Weight: 193 lb 11.2 oz (87.9 kg) IBW/kg (Calculated) : 75.3  Temp (24hrs), Avg:97.7 F (36.5 C), Min:97.5 F (36.4 C), Max:97.9 F (36.6 C)  No results for input(s): WBC, CREATININE, LATICACIDVEN, VANCOTROUGH, VANCOPEAK, VANCORANDOM, GENTTROUGH, GENTPEAK, GENTRANDOM, TOBRATROUGH, TOBRAPEAK, TOBRARND, AMIKACINPEAK, AMIKACINTROU, AMIKACIN in the last 168 hours.  Estimated Creatinine Clearance: 90.4 mL/min (by C-G formula based on SCr of 1.03 mg/dL).    Allergies  Allergen Reactions  . Flomax [Tamsulosin Hcl]     Caused CVA 12/2017  . Penicillins Hives and Rash    Has patient had a PCN reaction causing immediate rash, facial/tongue/throat swelling, SOB or lightheadedness with hypotension: no Has patient had a PCN reaction causing severe rash involving mucus membranes or skin necrosis: no Has patient had a PCN reaction that required hospitalization: no Has patient had a PCN reaction occurring within the last 10 years: no If all of the above answers are "NO", then may proceed with Cephalosporin use.     Thank you for allowing pharmacy to be a part of this patient's care.  Tera Materatherine A Arelie Kuzel, PharmD, [facility] 04/07/2018 4:07 PM

## 2018-04-07 NOTE — Transfer of Care (Signed)
Immediate Anesthesia Transfer of Care Note  Patient: Ellsworth Chaney  Procedure(s) Performed: ANTERIOR CERVICAL DECOMPRESSION/DISCECTOMY FUSION CERVICAL FIVE- CERVICAL SIX, CERVICAL SIX- CERVICAL SEVEN (N/A Spine Cervical)  Patient Location: PACU  Anesthesia Type:General  Level of Consciousness: sedated and patient cooperative  Airway & Oxygen Therapy: Patient Spontanous Breathing and Patient connected to face mask oxygen  Post-op Assessment: Report given to RN and Post -op Vital signs reviewed and stable  Post vital signs: Reviewed and stable  Last Vitals:  Vitals Value Taken Time  BP 154/107 04/07/2018  4:01 PM  Temp 36.4 C 04/07/2018  4:00 PM  Pulse 89 04/07/2018  4:03 PM  Resp 16 04/07/2018  4:03 PM  SpO2 97 % 04/07/2018  4:03 PM  Vitals shown include unvalidated device data.  Last Pain:  Vitals:   04/07/18 1047  TempSrc:   PainSc: 10-Worst pain ever      Patients Stated Pain Goal: 4 (04/07/18 1047)  Complications: No apparent anesthesia complications

## 2018-04-07 NOTE — Progress Notes (Signed)
Orthopedic Tech Progress Note Patient Details:  Craig Hamilton [birth year] [id number]  Ortho Devices Type of Ortho Device: Soft collar Ortho Device/Splint Location: neck Ortho Device/Splint Interventions: Application   Post Interventions Patient Tolerated: Well Instructions Provided: Care of device   Nikki DomCrawford, Maggie Senseney 04/07/2018, 4:32 PM

## 2018-04-07 NOTE — Op Note (Signed)
NEUROSURGERY OPERATIVE NOTE   PREOP DIAGNOSIS: Cervical spondylosis with radiculopathy, C5-6, C6-7  POSTOP DIAGNOSIS: Same  PROCEDURE: 1. Discectomy at C5-6, C6-7 for decompression of spinal cord and exiting nerve roots  2. Placement of intervertebral biomechanical device - Medtronic PTC PEEK 37m x2 3. Placement of anterior instrumentation consisting of interbody plate and screws [id number]- Medtronic Atlantis translational plate  4. Use of morselized bone allograft  5. Arthrodesis C5-6, C6-7, anterior interbody technique  6. Use of intraoperative microscope  SURGEON: Dr. NConsuella Lose MD  ASSISTANT: Dr. JErline Levine MD  ANESTHESIA: General Endotracheal  EBL: 100cc  SPECIMENS: None  DRAINS: None  COMPLICATIONS: None immediate  CONDITION: Hemodynamically stable to PACU  HISTORY: AMaxen Rowlandis a 52y.o. y.o. male who initially presented to the outpatient clinic with signs and symptoms consistent with cervical radiculopathy including neck and L>R arm pain. MRI demonstrated foraminal stenosis worst at C5-6 and to a lesser extent at C6-7. Treatment options were discussed including continued conservative treatment versus surgical decompression and fusion.  Patient elected to proceed with ACDF. After all questions were answered, informed consent was obtained.  PROCEDURE IN DETAIL: The patient was brought to the operating room and transferred to the operative table. After induction of general anesthesia, the patient was positioned on the operative table in the supine position with all pressure points meticulously padded. The skin of the neck was then prepped and draped in the usual sterile fashion.  After timeout was conducted, the skin was infiltrated with local anesthetic. Skin incision was then made sharply and Bovie electrocautery was used to dissect the subcutaneous tissue until the platysma was identified. The platysma was then divided and undermined. The sternocleidomastoid  muscle was then identified and, utilizing natural fascial planes in the neck, the prevertebral fascia was identified and the carotid sheath was retracted laterally and the trachea and esophagus retracted medially. Again using fluoroscopy, the correct disc space was identified. Bovie electrocautery was used to dissect in the subperiosteal plane and elevate the bilateral longus coli muscles. Table-mounted retractors were then placed. At this point, the microscope was draped and brought into the field, and the remainder of the case was done under the microscope using microdissecting technique.  The C5-6 disc space was incised sharply and rongeurs were use to initially complete a discectomy. The high-speed drill was then used to complete discectomy until the posterior annulus was identified and removed and the posterior longitudinal ligament was identified. Using a nerve hook, the PLL was elevated, and Kerrison rongeurs were used to remove the posterior longitudinal ligament and the ventral thecal sac was identified. Using a combination of curettes and rongeurs, complete decompression of the thecal sac and exiting nerve roots at this level was completed.  There did appear to be a large osteophyte emanating from the superior aspect of the C6 endplate compressing the exiting left C6 nerve root.  The nerve root was completely decompressed by removal of the osteophyte and removal of the left uncovertebral joint.  Trial spacers were used to select a 6 mm lordotic graft. This graft was then filled with morcellized allograft, and inserted.  In a similar fashion, the C6-7 disc space was identified, incised sharply, and superficial discectomy was completed with rogeurs.  High-speed drill was was then used to complete the discectomy and the posterior longitudinal ligament was identified.  This was then elevated with a nerve hook, and incised with #1 Kerrison punches.  PLL was then removed, and osteophytes were also removed  from  the endplates.  The left C7 nerve root was identified and completely decompressed by removal of some spondylotic tissue.    Having completed our decompression, attention was turned to placement of the intervertebral device. Trial spacers were used to select a 6 mm lordotic graft. This graft was then filled with morcellized allograft, and inserted.  After placement of the intervertebral device, the above anterior cervical plate was selected, and placed across the interspace. Using a high-speed drill, the cortex of the cervical vertebral bodies was punctured, and screws inserted in C5, C6, C7. Final fluoroscopic images in lateral projections were taken to confirm good hardware placement.  At this point, after all counts were verified to be correct, meticulous hemostasis was secured using a combination of bipolar electrocautery and passive hemostatics. The platysma muscle was then closed using interrupted 3-0 Vicryl sutures, and the skin was closed with an interrupted 3-0 Vicry subcuticular stitch. Dermabond and sterile dressings were then applied and the drapes removed.  The patient tolerated the procedure well and was extubated in the room and taken to the postanesthesia care unit in stable condition.

## 2018-04-07 NOTE — H&P (Signed)
Chief Complaint   Neck pain  HPI   HPI: Craig Hamilton is a 52 y.o. male with history of chronic neck pain and left greater than right upper extremity pain. MRI of the cervical spine was ordered and significant for degenerative changes at C5-6 and C6-7. At C5-6 where there is broad-based disc osteophyte complex eccentric slightly to the left with severe left-sided foraminal stenosis, and to a lesser extent right-sided stenosis.  There is also broad-based disc bulge at C6-7 and resultant mild to moderate foraminal stenosis. He has failed reasonable conservative treatment to date. He presents today for ACDF. He is without any concerns today.  There are no active problems to display for this patient.   PMH: Past Medical History:  Diagnosis Date  . Anxiety   . Arthritis   . Depression   . Difficulty urinating   . Fibromyalgia   . High cholesterol   . Hypertension     PSH: Past Surgical History:  Procedure Laterality Date  . APPENDECTOMY    . CHOLECYSTECTOMY    . HERNIA REPAIR     groin left  . TONSILLECTOMY      Medications Prior to Admission  Medication Sig Dispense Refill Last Dose  . aspirin EC 81 MG tablet Take 81 mg by mouth daily.     Marland Kitchen atorvastatin (LIPITOR) 80 MG tablet Take 80 mg by mouth at bedtime.     . cyclobenzaprine (FLEXERIL) 10 MG tablet Take 10 mg by mouth at bedtime.     . fenofibrate 160 MG tablet Take 160 mg by mouth daily.     Marland Kitchen gabapentin (NEURONTIN) 300 MG capsule Take 600 mg by mouth 3 (three) times daily.     Marland Kitchen gemfibrozil (LOPID) 600 MG tablet Take 600 mg by mouth 2 (two) times daily before a meal.     . lisinopril-hydrochlorothiazide (PRINZIDE,ZESTORETIC) 20-12.5 MG tablet Take 1 tablet by mouth daily.     . Oxycodone HCl 20 MG TABS Take 15 mg by mouth 4 (four) times daily.      Marland Kitchen testosterone cypionate (DEPOTESTOSTERONE CYPIONATE) 200 MG/ML injection Inject 200 mg into the muscle every 14 (fourteen) days. Dr. Hyacinth Meeker administers  2     SH: Social  History   Tobacco Use  . Smoking status: Current Every Day Smoker    Packs/day: 1.00    Types: Cigarettes  . Smokeless tobacco: Never Used  . Tobacco comment: patches help  Substance Use Topics  . Alcohol use: Never    Frequency: Never  . Drug use: Never    MEDS: Prior to Admission medications   Medication Sig Start Date End Date Taking? Authorizing Provider  aspirin EC 81 MG tablet Take 81 mg by mouth daily.   Yes [provider]  atorvastatin (LIPITOR) 80 MG tablet Take 80 mg by mouth at bedtime.   Yes [provider]  cyclobenzaprine (FLEXERIL) 10 MG tablet Take 10 mg by mouth at bedtime.   Yes [provider]  fenofibrate 160 MG tablet Take 160 mg by mouth daily.   Yes [provider]  gabapentin (NEURONTIN) 300 MG capsule Take 600 mg by mouth 3 (three) times daily.   Yes [provider]  gemfibrozil (LOPID) 600 MG tablet Take 600 mg by mouth 2 (two) times daily before a meal.   Yes [provider]  lisinopril-hydrochlorothiazide (PRINZIDE,ZESTORETIC) 20-12.5 MG tablet Take 1 tablet by mouth daily.   Yes [provider]  Oxycodone HCl 20 MG TABS Take 15 mg  by mouth 4 (four) times daily.    Yes [provider]  testosterone cypionate (DEPOTESTOSTERONE CYPIONATE) 200 MG/ML injection Inject 200 mg into the muscle every 14 (fourteen) days. Dr. Hyacinth MeekerMiller administers 02/25/18  Yes [provider]    ALLERGY: Allergies  Allergen Reactions  . Flomax [Tamsulosin Hcl]     Caused CVA 12/2017  . Penicillins Hives and Rash    Has patient had a PCN reaction causing immediate rash, facial/tongue/throat swelling, SOB or lightheadedness with hypotension: no Has patient had a PCN reaction causing severe rash involving mucus membranes or skin necrosis: no Has patient had a PCN reaction that required hospitalization: no Has patient had a PCN reaction occurring within the last 10 years: no If all of the above answers are  "NO", then may proceed with Cephalosporin use.     Social History   Tobacco Use  . Smoking status: Current Every Day Smoker    Packs/day: 1.00    Types: Cigarettes  . Smokeless tobacco: Never Used  . Tobacco comment: patches help  Substance Use Topics  . Alcohol use: Never    Frequency: Never     No family history on file.   ROS   ROS  Exam   Vitals:   04/07/18 1006  BP: (!) 162/101  Pulse: 89  Resp: 20  Temp: 97.9 F (36.6 C)  SpO2: 98%   General appearance: WDWN, resting comfortably Eyes: PERRL, Fundoscopic: normal Cardiovascular: Regular rate and rhythm without murmurs, rubs, gallops. No edema or variciosities. Distal pulses normal. Pulmonary: Clear to auscultation Musculoskeletal:     Muscle tone upper extremities: Normal    Muscle tone lower extremities: Normal    Motor exam: Upper Extremities Deltoid Bicep Tricep Grip  Right 5/5 5/5 5/5 5/5  Left 5/5 5/5 5/5 5/5   Lower Extremity IP Quad PF DF EHL  Right 5/5 5/5 5/5 5/5 5/5  Left 5/5 5/5 5/5 5/5 5/5   Neurological Awake, alert, oriented Memory and concentration grossly intact Speech fluent, appropriate CNII: Visual fields normal CNIII/IV/VI: EOMI CNV: Facial sensation normal CNVII: Symmetric, normal strength CNVIII: Grossly normal CNIX: Normal palate movement CNXI: Trap and SCM strength normal CN XII: Tongue protrusion normal Sensation grossly intact to LT DTR: Normal Coordination (finger/nose & heel/shin): Normal Negative Hoffmans  Results - Imaging/Labs   No results found for this or any previous visit (from the past 48 hour(s)).  No results found.  Impression/Plan   52 y.o. male with approximately 2 months of relatively severe left greater than right neck and arm pain likely related to multilevel cervical spondylosis worse at C5-6 and C6-7.  While in the office, the risks of surgery were discussed in detail with the patient which include but are not limited to spinal cord injury  which may result in hand, leg, and bowel dysfunction, postoperative dysphagia, dysphonia, neck hematoma, or subsequent surgery for epidural hematoma.  The risk of CSF leak was also discussed. In addition, explained to him that after spinal fusion surgery, there is a risk of adjacent level disease requiring future surgical intervention. The possibility of continued/worsening pain after surgery was also discussed. The general risks of anesthesia were also reviewed including heart attack, stroke, and DVT/PE.    The patient understood the discussion as well as the risks of the surgery and is willing to proceed.  All questions were answered. Consent has been signed.

## 2018-04-07 NOTE — Anesthesia Procedure Notes (Signed)
Procedure Name: Intubation Date/Time: 04/07/2018 1:26 PM Performed by: Rosiland OzMeyers, Onnie Hatchel, CRNA Pre-anesthesia Checklist: Patient identified, Emergency Drugs available, Suction available, Patient being monitored and Timeout performed Patient Re-evaluated:Patient Re-evaluated prior to induction Oxygen Delivery Method: Circle system utilized Preoxygenation: Pre-oxygenation with 100% oxygen Induction Type: IV induction Ventilation: Mask ventilation without difficulty Laryngoscope Size: Miller and 3 Grade View: Grade II Tube type: Oral Tube size: 7.5 mm Number of attempts: 1 Airway Equipment and Method: Stylet Placement Confirmation: ETT inserted through vocal cords under direct vision,  positive ETCO2 and breath sounds checked- equal and bilateral Secured at: 21 cm Tube secured with: Tape Dental Injury: Teeth and Oropharynx as per pre-operative assessment

## 2018-04-07 NOTE — Anesthesia Preprocedure Evaluation (Signed)
Anesthesia Evaluation  Patient identified by MRN, date of birth, ID band Patient awake    Reviewed: Allergy & Precautions, NPO status , Patient's Chart, lab work & pertinent test results  History of Anesthesia Complications Negative for: history of anesthetic complications  Airway Mallampati: III  TM Distance: >3 FB Neck ROM: Full    Dental  (+) Edentulous Upper, Edentulous Lower   Pulmonary Current Smoker,    breath sounds clear to auscultation       Cardiovascular hypertension, Pt. on medications (-) angina(-) CHF  Rhythm:Regular     Neuro/Psych PSYCHIATRIC DISORDERS Anxiety Depression  Neuromuscular disease    GI/Hepatic negative GI ROS, Neg liver ROS,   Endo/Other  negative endocrine ROS  Renal/GU negative Renal ROS     Musculoskeletal  (+) Arthritis , Fibromyalgia -  Abdominal   Peds  Hematology negative hematology ROS (+)   Anesthesia Other Findings   Reproductive/Obstetrics                             Anesthesia Physical Anesthesia Plan  ASA: II  Anesthesia Plan: General   Post-op Pain Management:    Induction: Intravenous  PONV Risk Score and Plan: 1 and Ondansetron and Dexamethasone  Airway Management Planned: Oral ETT  Additional Equipment: None  Intra-op Plan:   Post-operative Plan: Extubation in OR  Informed Consent: I have reviewed the patients History and Physical, chart, labs and discussed the procedure including the risks, benefits and alternatives for the proposed anesthesia with the patient or authorized representative who has indicated his/her understanding and acceptance.   Dental advisory given  Plan Discussed with: CRNA and Surgeon  Anesthesia Plan Comments:         Anesthesia Quick Evaluation

## 2018-04-08 ENCOUNTER — Encounter (HOSPITAL_COMMUNITY): Payer: Self-pay | Admitting: Neurosurgery

## 2018-04-08 DIAGNOSIS — M4722 Other spondylosis with radiculopathy, cervical region: Secondary | ICD-10-CM | POA: Diagnosis not present

## 2018-04-08 LAB — CBC
HCT: 38.9 % — ABNORMAL LOW (ref 39.0–52.0)
Hemoglobin: 12.9 g/dL — ABNORMAL LOW (ref 13.0–17.0)
MCH: 30.3 pg (ref 26.0–34.0)
MCHC: 33.2 g/dL (ref 30.0–36.0)
MCV: 91.3 fL (ref 78.0–100.0)
Platelets: 221 10*3/uL (ref 150–400)
RBC: 4.26 MIL/uL (ref 4.22–5.81)
RDW: 13.5 % (ref 11.5–15.5)
WBC: 12.7 10*3/uL — ABNORMAL HIGH (ref 4.0–10.5)

## 2018-04-08 LAB — PROTIME-INR
INR: 1.02
Prothrombin Time: 13.3 seconds (ref 11.4–15.2)

## 2018-04-08 LAB — BASIC METABOLIC PANEL
Anion gap: 7 (ref 5–15)
BUN: 7 mg/dL (ref 6–20)
CO2: 28 mmol/L (ref 22–32)
Calcium: 9 mg/dL (ref 8.9–10.3)
Chloride: 107 mmol/L (ref 98–111)
Creatinine, Ser: 1.01 mg/dL (ref 0.61–1.24)
GFR calc Af Amer: >60 mL/min (ref >60)
GFR calc non Af Amer: >60 mL/min (ref >60)
Glucose, Bld: 151 mg/dL — ABNORMAL HIGH (ref 70–99)
Potassium: 4.5 mmol/L (ref 3.5–5.1)
Sodium: 142 mmol/L (ref 135–145)

## 2018-04-08 LAB — APTT: aPTT: 26 seconds (ref 24–36)

## 2018-04-08 MED ORDER — ASPIRIN EC 81 MG PO TBEC: 81.0000 mg | DELAYED_RELEASE_TABLET | Freq: Every day | ORAL | 0 refills | Status: AC

## 2018-04-08 MED FILL — Thrombin (Recombinant) For Soln 20000 Unit: CUTANEOUS | Qty: 1 | Status: AC

## 2018-04-08 NOTE — Evaluation (Signed)
Physical Therapy Evaluation and Discharge Patient Details Name: Craig Hamilton MRN: [id number] DOB: Dec 04, 1965 Today's Date: 04/08/2018   History of Present Illness  Pt is a 52 y/o male who presents s/p C5-C7 ACDF on 04/07/18. PMH significant for fibromyalgia, HTN, hernia repair.   Clinical Impression  Patient evaluated by Physical Therapy with no further acute PT needs identified. All education has been completed and the patient has no further questions. At the time of PT eval pt was able to perform transfers and ambulation with gross modified independence without an AD. Pt was educated on brace application/wearing schedule, recommended positioning at home, precautions, car transfer, and activity progression. See below for any follow-up Physical Therapy or equipment needs. PT is signing off. Thank you for this referral.     Follow Up Recommendations No PT follow up;Supervision - Intermittent    Equipment Recommendations  None recommended by PT    Recommendations for Other Services       Precautions / Restrictions Precautions Precautions: Fall;Cervical Precaution Booklet Issued: Yes (comment) Precaution Comments: Reviewed handout in detail with pt and wife. Pt was cued for precautions during functional mobility.  Required Braces or Orthoses: Cervical Brace Cervical Brace: Soft collar(Can remove in bed, to shower, and amb to bathroom. ) Restrictions Weight Bearing Restrictions: No      Mobility  Bed Mobility Overal bed mobility: Modified Independent Bed Mobility: Rolling;Sidelying to Sit           General bed mobility comments: Pt demonstrated proper log roll technique without assistance. Light cues for proper form.   Transfers Overall transfer level: Modified independent Equipment used: None             General transfer comment: Pt demonstrated min use of UE's on seated surface for safety. No assist required to power up to full stand.    Ambulation/Gait Ambulation/Gait assistance: Modified independent (Device/Increase time) Gait Distance (Feet): 300 Feet Assistive device: None Gait Pattern/deviations: Step-through pattern;Decreased stride length Gait velocity: Somewhat decreased Gait velocity interpretation: 1.31 - 2.62 ft/sec, indicative of limited community ambulator General Gait Details: No unsteadiness or LOB noted. Pt ambulating casually throughout session and does not appear guarded due to pain.   Stairs         General stair comments: Pt reports no stairs at home, so stair training deferred.   Wheelchair Mobility    Modified Rankin (Stroke Patients Only)       Balance Overall balance assessment: No apparent balance deficits (not formally assessed)                                           Pertinent Vitals/Pain Pain Assessment: Faces Faces Pain Scale: Hurts a little bit Pain Location: Incision site Pain Descriptors / Indicators: Operative site guarding Pain Intervention(s): Monitored during session    Home Living Family/patient expects to be discharged to:: Private residence Living Arrangements: Spouse/significant other Available Help at Discharge: Family Type of Home: House Home Access: Level entry     Home Layout: One level Home Equipment: None Additional Comments: Reports he had a shower seat and recently sold it    Prior Function Level of Independence: Independent               Hand Dominance        Extremity/Trunk Assessment   Upper Extremity Assessment Upper Extremity Assessment: LUE deficits/detail LUE Deficits / Details: Pt reports  severe N/T and pain in LUE prior to surgery and now states he has no pain in the LUE.     Lower Extremity Assessment Lower Extremity Assessment: Overall WFL for tasks assessed    Cervical / Trunk Assessment Cervical / Trunk Assessment: Other exceptions Cervical / Trunk Exceptions: s/p surgery  Communication    Communication: No difficulties  Cognition Arousal/Alertness: Awake/alert Behavior During Therapy: WFL for tasks assessed/performed Overall Cognitive Status: Within Functional Limits for tasks assessed                                        General Comments      Exercises     Assessment/Plan    PT Assessment Patent does not need any further PT services  PT Problem List         PT Treatment Interventions      PT Goals (Current goals can be found in the Care Plan section)  Acute Rehab PT Goals Patient Stated Goal: Home today PT Goal Formulation: All assessment and education complete, DC therapy    Frequency     Barriers to discharge        Co-evaluation               AM-PAC PT "6 Clicks" Daily Activity  Outcome Measure Difficulty turning over in bed (including adjusting bedclothes, sheets and blankets)?: None Difficulty moving from lying on back to sitting on the side of the bed? : None Difficulty sitting down on and standing up from a chair with arms (e.g., wheelchair, bedside commode, etc,.)?: None Help needed moving to and from a bed to chair (including a wheelchair)?: None Help needed walking in hospital room?: None Help needed climbing 3-5 steps with a railing? : A Little 6 Click Score: 23    End of Session Equipment Utilized During Treatment: Gait belt;Cervical collar Activity Tolerance: Patient tolerated treatment well Patient left: with call bell/phone within reach;with family/visitor present;Other (comment)(Sitting EOB) Nurse Communication: Mobility status PT Visit Diagnosis: Pain Pain - part of body: (Neck)    Time: 4098-1191 PT Time Calculation (min) (ACUTE ONLY): 20 min   Charges:   PT Evaluation $PT Eval Moderate Complexity: 1 Mod     PT G Codes:        Conni Slipper, PT, DPT Acute Rehabilitation Services Pager: [phone]   Marylynn Pearson 04/08/2018, 8:37 AM

## 2018-04-08 NOTE — Anesthesia Postprocedure Evaluation (Signed)
Anesthesia Post Note  Patient: [name]  Procedure(s) Performed: ANTERIOR CERVICAL DECOMPRESSION/DISCECTOMY FUSION CERVICAL FIVE- CERVICAL SIX, CERVICAL SIX- CERVICAL SEVEN (N/A Spine Cervical)     Patient location during evaluation: PACU Anesthesia Type: General Level of consciousness: awake and alert Pain management: pain level controlled Vital Signs Assessment: post-procedure vital signs reviewed and stable Respiratory status: spontaneous breathing, nonlabored ventilation, respiratory function stable and patient connected to nasal cannula oxygen Cardiovascular status: blood pressure returned to baseline and stable Postop Assessment: no apparent nausea or vomiting Anesthetic complications: no    Last Vitals:  Vitals:   04/08/18 0339 04/08/18 0727  BP: (!) 159/95 (!) 148/98  Pulse: (!) 101 84  Resp: 18 18  Temp: 36.4 C 36.7 C  SpO2: 95% 95%    Last Pain:  Vitals:   04/08/18 0727  TempSrc: Oral  PainSc:                  Craig Hamilton

## 2018-04-08 NOTE — Progress Notes (Signed)
Patient is discharged from room 3C09 at this time. Alert and in stable condition. IV site d/c'd and instructions read to patient and spouse with understanding verbalized. Left unit via wheelchair with all belongings at side. 

## 2018-04-08 NOTE — Discharge Summary (Signed)
Physician Discharge Summary  Patient ID: Craig Hamilton MRN: [id number] DOB/AGE: 11-12-1965 52 y.o.  Admit date: 04/07/2018 Discharge date: 04/08/2018  Admission Diagnoses:  Cervical radiculopathy  Discharge Diagnoses:  Same Active Problems:   Cervical radiculopathy  Discharged Condition: Stable  Hospital Course:  Craig Hamilton is a 52 y.o. male who was admitted for the below procedure. There were no post operative complications. He did have some minor redness and swelling underlying the outline of the bandage. However, denies dysphagia, dyspnea. Will send home with medrol dose pack po to use if necessary. Dsicussed precautions. He is under pain management and therefor does not need narcotics. At time of discharge, pain was well controlled, ambulating with Pt/OT, tolerating po, voiding normal. Ready for discharge.  Treatments: Surgery 1. Discectomy at C5-6, C6-7 for decompression of spinal cord and exiting nerve roots  2. Placement of intervertebral biomechanical device - Medtronic PTC PEEK 61m x2 3. Placement of anterior instrumentation consisting of interbody plate and screws CY4-I3- Medtronic Atlantis translational plate  4. Use of morselized bone allograft  5. Arthrodesis C5-6, C6-7, anterior interbody technique  6. Use of intraoperative microscope   Discharge Exam: Blood pressure (!) 148/98, pulse 84, temperature 98.1 F (36.7 C), temperature source Oral, resp. rate 18, height '5\' 11"'  (1.803 m), weight 87.9 kg (193 lb 11.2 oz), SpO2 95 %. Awake, alert, oriented Speech fluent, appropriate CN grossly intact 5/5 BUE/BLE Mild swelling and redness   Disposition: Discharge disposition: 01-Home or [facility] Discharge Instructions    Call MD for:  difficulty breathing, headache or visual disturbances   Complete by:  As directed    Call MD for:  persistant dizziness or light-headedness   Complete by:  As directed    Call MD for:  redness, tenderness, or signs of  infection (pain, swelling, redness, odor or green/yellow discharge around incision site)   Complete by:  As directed    Call MD for:  severe uncontrolled pain   Complete by:  As directed    Call MD for:  temperature >100.4   Complete by:  As directed    Diet general   Complete by:  As directed    Driving Restrictions   Complete by:  As directed    Do not drive until given clearance.   Increase activity slowly   Complete by:  As directed    Lifting restrictions   Complete by:  As directed    Do not lift anything >10lbs. Avoid bending and twisting in awkward positions. Avoid bending at the back.   May shower / Bathe   Complete by:  As directed    In 24 hours. Okay to wash wound with warm soapy water. Avoid scrubbing the wound. Pat dry.   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      Medication List  TAKE these medications   aspirin EC 81 MG tablet Take 1 tablet (81 mg total) by mouth daily.   atorvastatin 80 MG tablet Commonly known as:  LIPITOR Take 80 mg by mouth at bedtime.   cyclobenzaprine 10 MG tablet Commonly known as:  FLEXERIL Take 10 mg by mouth at bedtime.   fenofibrate 160 MG tablet Take 160 mg by mouth daily.   gabapentin 300 MG capsule Commonly known as:  NEURONTIN Take 600 mg by mouth 3 (three) times daily.   gemfibrozil 600 MG tablet Commonly known as:  LOPID Take 600 mg by  mouth 2 (two) times daily before a meal.   lisinopril-hydrochlorothiazide 20-12.5 MG tablet Commonly known as:  PRINZIDE,ZESTORETIC Take 1 tablet by mouth daily.   Oxycodone HCl 20 MG Tabs Take 15 mg by mouth 4 (four) times daily.   testosterone cypionate 200 MG/ML injection Commonly known as:  DEPOTESTOSTERONE CYPIONATE Inject 200 mg into the muscle every 14 (fourteen) days. Dr. Sabra Heck administers      Follow-up Information    Consuella Lose, MD. Schedule an appointment as soon as possible for a visit in 3 week(s).   Specialty:  Neurosurgery Contact information: [address] [place] [address] [phone]           Signed: Traci Sermon 04/08/2018, 9:41 AM

## 2018-04-08 NOTE — Discharge Instructions (Signed)
Wound Care °Leave incision open to air. °You may shower. °Do not scrub directly on incision.  °Do not put any creams, lotions, or ointments on incision. °Activity °Walk each and every day, increasing distance each day. °No lifting greater than 5 lbs.  Avoid excessive neck motion. °No driving for 2 weeks; may ride as a passenger locally. °Wear neck brace at all times except when showering.  If provided soft collar, may wear for comfort unless otherwise instructed. °Diet °Resume your normal diet.  °Return to Work °Will be discussed at you follow up appointment. °Call Your Doctor If Any of These Occur °Redness, drainage, or swelling at the wound.  °Temperature greater than 101 degrees. °Severe pain not relieved by pain medication. °Increased difficulty swallowing. °Incision starts to come apart. °Follow Up Appt °Call today for appointment in 1-2 weeks ([phone]) or for problems.  If you have any hardware placed in your spine, you will need an x-ray before your appointment. °

## 2018-04-08 NOTE — Progress Notes (Signed)
OT Note - Addendum    04/08/18 1000  OT Visit Information  Last OT Received On 04/08/18  OT Time Calculation  OT Start Time (ACUTE ONLY) 0840  OT Stop Time (ACUTE ONLY) 0856  OT Time Calculation (min) 16 min  OT General Charges  $OT Visit 1 Visit  OT Evaluation  $OT Eval Low Complexity 1 Low  Luisa DagoHilary Myeasha Ballowe, OT/L  OT Clinical Specialist [phone]

## 2018-04-08 NOTE — Progress Notes (Signed)
Occupational Therapy Evaluation Patient Details Name: Craig Hamilton MRN: [id number] DOB: December 04, 1965 Today's Date: 04/08/2018    History of Present Illness Pt is a 52 y/o male who presents s/p C5-C7 ACDF on 04/07/18. PMH significant for fibromyalgia, HTN, hernia repair.    Clinical Impression   PTA, pt was living at home with wife. Per pt report, PTA, pt required assistance with ADLs secondary to limitations from fibromyalgia and was independent with functional mobiltiy. Pt currently requires S for functional mobility for adherence to precautions. Pt able to return demonstrate use of AE to complete LB dressing with minguard assist. All education complete and pt/wife have no additional questions, no further OT needs identified. Pt ready to d/c home with supportive wife when medically stable. OT to sign off. Thank you for referral.        Follow Up Recommendations  No OT follow up;Supervision - Intermittent    Equipment Recommendations  None recommended by OT    Recommendations for Other Services       Precautions / Restrictions Precautions Precautions: Fall;Cervical Precaution Booklet Issued: Yes (comment) Precaution Comments: Reviewed handout in detail with pt and wife. Pt was cued for precautions during functional mobility.  Required Braces or Orthoses: Cervical Brace Cervical Brace: Soft collar(Can remove in bed, to shower, and amb to bathroom. ) Restrictions Weight Bearing Restrictions: No      Mobility Bed Mobility Overal bed mobility: Modified Independent Bed Mobility: Rolling;Sidelying to Sit             Transfers Overall transfer level: Modified independent Equipment used: None             General transfer comment: Pt demonstrated min use of UE's on seated surface for safety. No assist required to power up to full stand.     Balance Overall balance assessment: No apparent balance deficits (not formally assessed)                                          ADL either performed or assessed with clinical judgement   ADL Overall ADL's : Needs assistance/impaired                                     Functional mobility during ADLs: Supervision/safety General ADL Comments: pt minguard for adherence to precautions with dressing, transfers, grooming;pt able to return demonstrate use of AE to complete LB dressing;pt plans to purchase at later time;pt verbalized understanding of wear schedule for cervical collar;pt required min VC to maintain precautions while completing ADLs;pt wife verbalized understanding of precautions and is available to provide physical assistance 24/7     Vision Baseline Vision/History: (wears contacts) Patient Visual Report: No change from baseline       Perception     Praxis      Pertinent Vitals/Pain Pain Assessment: 0-10 Pain Score: 8  Faces Pain Scale: Hurts a little bit Pain Location: Incision site Pain Descriptors / Indicators: Operative site guarding Pain Intervention(s): Monitored during session;Limited activity within patient's tolerance     Hand Dominance Right   Extremity/Trunk Assessment Upper Extremity Assessment Upper Extremity Assessment: Overall WFL for tasks assessed LUE Deficits / Details: Pt reports severe N/T and pain in LUE prior to surgery and now states he has no pain in the LUE;educated pt to notify MD if any  changes occur in sensation, coordination, strength    Lower Extremity Assessment Lower Extremity Assessment: Defer to PT evaluation   Cervical / Trunk Assessment Cervical / Trunk Assessment: Other exceptions Cervical / Trunk Exceptions: s/p surgery   Communication Communication Communication: No difficulties   Cognition Arousal/Alertness: Awake/alert Behavior During Therapy: WFL for tasks assessed/performed Overall Cognitive Status: Within Functional Limits for tasks assessed                                     General  Comments  pt's wife present during session;educated pt/wife on environmental modifications for adherence to precautions    Exercises     Shoulder Instructions      Home Living Family/patient expects to be discharged to:: Private residence Living Arrangements: Spouse/significant other Available Help at Discharge: Family Type of Home: House Home Access: Level entry     Home Layout: One level     Bathroom Shower/Tub: Chief Strategy Officer: Standard     Home Equipment: None   Additional Comments: pt reports he will be gettting shower seat after d/c      Prior Functioning/Environment Level of Independence: Needs assistance    ADL's / Homemaking Assistance Needed: pt's wife assisted with LB dressing and showers secondary to limitations from fibromyalgia             OT Problem List: Decreased range of motion;Decreased activity tolerance;Impaired balance (sitting and/or standing);Decreased safety awareness;Decreased knowledge of use of DME or AE;Decreased knowledge of precautions;Pain      OT Treatment/Interventions:      OT Goals(Current goals can be found in the care plan section) Acute Rehab OT Goals Patient Stated Goal: to go home today OT Goal Formulation: With patient/family Time For Goal Achievement: 04/15/18 Potential to Achieve Goals: Good  OT Frequency:     Barriers to D/C:            Co-evaluation              AM-PAC PT "6 Clicks" Daily Activity     Outcome Measure Help from another person eating meals?: None Help from another person taking care of personal grooming?: A Little Help from another person toileting, which includes using toliet, bedpan, or urinal?: A Little Help from another person bathing (including washing, rinsing, drying)?: A Little Help from another person to put on and taking off regular upper body clothing?: A Little Help from another person to put on and taking off regular lower body clothing?: A Little 6 Click  Score: 19   End of Session Equipment Utilized During Treatment: Cervical collar Nurse Communication: Mobility status  Activity Tolerance: Patient tolerated treatment well Patient left: in bed;with call bell/phone within reach;with family/visitor present  OT Visit Diagnosis: Other abnormalities of gait and mobility (R26.89);Pain;Other (comment)(fibromyalgia) Pain - part of body: (neck)                Time: 1610-9604 OT Time Calculation (min): 16 min Charges:    G-Codes:     Diona Browner OTS    Diona Browner 04/08/2018, 9:27 AM

## 2020-02-28 IMAGING — RF DG CERVICAL SPINE 2 OR 3 VIEWS
1 series · 2 of 2 positions shown · non-contrast
Comparison: None.

CLINICAL DATA: 51-year-old male with a history of ACDF C5-C6, C6-C7

EXAM:
DG C-ARM 61-120 MIN; CERVICAL SPINE - 2-3 VIEW

[Series 1: run · 2 of 2 slices shown]
[im 1/2]
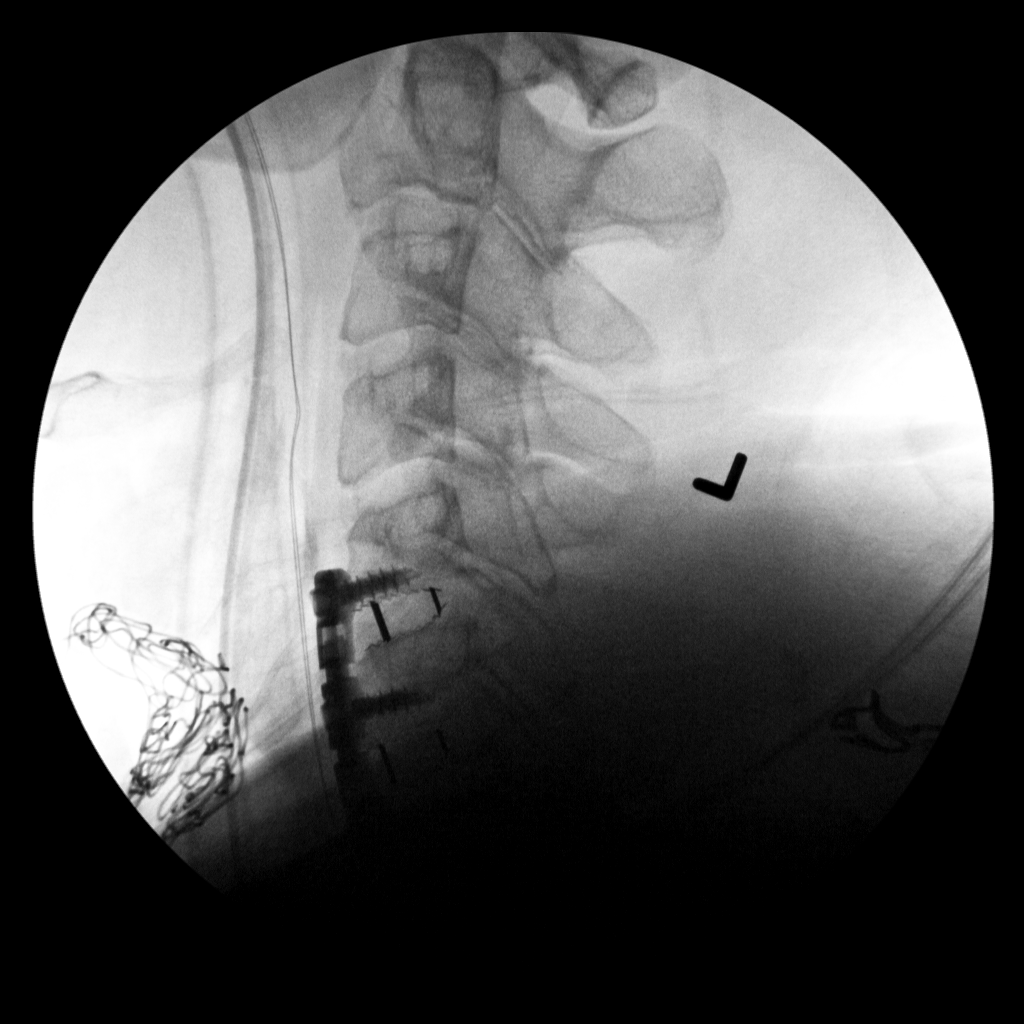
[im 2/2]
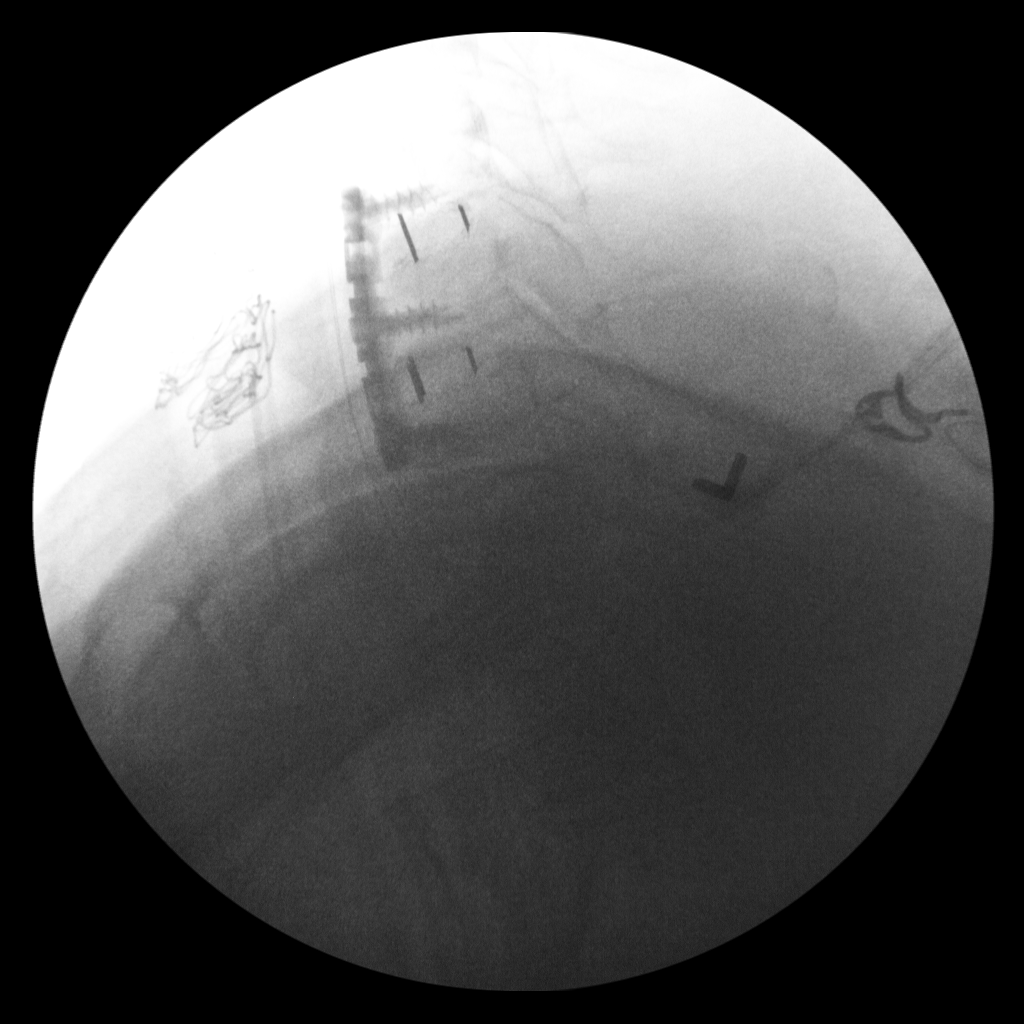

[2 of 2 positions shown; findings below may reference images not displayed]

FINDINGS: Intraoperative cross-table lateral the cervical spine.

Surgical changes of anterior cervical discectomy infusion with
anterior plate and screw fixation and interbody spacer placement at
the C5-C6, C6-C7 levels. No fracture identified. No complicating
features.

Endotracheal tube. Surgical sponges within the anterior soft
tissues.
IMPRESSION: Limited cross-table lateral images of the cervical spine demonstrate
ACDF of C5 through C7 with anterior plate and screw fixation. Please
refer to the dictated operative report for full details of
intraoperative findings and procedure.

## 2021-02-17 DIAGNOSIS — I4891 Unspecified atrial fibrillation: Secondary | ICD-10-CM
